# Patient Record
Sex: Male | Born: 1970 | Race: White | Hispanic: No | Marital: Married | Smoking: Never smoker
Health system: Southern US, Community
[De-identification: ages and names within clinical notes are randomized; demographics above are authoritative.]

## PROBLEM LIST (undated history)

## (undated) DIAGNOSIS — E119 Type 2 diabetes mellitus without complications: Secondary | ICD-10-CM

---

## 2013-07-22 ENCOUNTER — Emergency Department (HOSPITAL_COMMUNITY): Payer: BC Managed Care – PPO

## 2013-07-22 ENCOUNTER — Encounter (HOSPITAL_COMMUNITY): Payer: Self-pay | Admitting: Emergency Medicine

## 2013-07-22 ENCOUNTER — Emergency Department (HOSPITAL_COMMUNITY)
Admission: EM | Admit: 2013-07-22 | Discharge: 2013-07-22 | Disposition: A | Discharge status: Home / Self Care | Payer: BC Managed Care – PPO | Attending: Emergency Medicine | Admitting: Emergency Medicine

## 2013-07-22 DIAGNOSIS — S8990XA Unspecified injury of unspecified lower leg, initial encounter: Secondary | ICD-10-CM | POA: Insufficient documentation

## 2013-07-22 DIAGNOSIS — M25562 Pain in left knee: Secondary | ICD-10-CM

## 2013-07-22 DIAGNOSIS — S99929A Unspecified injury of unspecified foot, initial encounter: Secondary | ICD-10-CM

## 2013-07-22 DIAGNOSIS — S022XXA Fracture of nasal bones, initial encounter for closed fracture: Secondary | ICD-10-CM

## 2013-07-22 DIAGNOSIS — S4980XA Other specified injuries of shoulder and upper arm, unspecified arm, initial encounter: Secondary | ICD-10-CM | POA: Insufficient documentation

## 2013-07-22 DIAGNOSIS — M25511 Pain in right shoulder: Secondary | ICD-10-CM

## 2013-07-22 DIAGNOSIS — Y92410 Unspecified street and highway as the place of occurrence of the external cause: Secondary | ICD-10-CM | POA: Insufficient documentation

## 2013-07-22 DIAGNOSIS — Z791 Long term (current) use of non-steroidal anti-inflammatories (NSAID): Secondary | ICD-10-CM | POA: Insufficient documentation

## 2013-07-22 DIAGNOSIS — S99919A Unspecified injury of unspecified ankle, initial encounter: Secondary | ICD-10-CM

## 2013-07-22 DIAGNOSIS — Y9389 Activity, other specified: Secondary | ICD-10-CM | POA: Insufficient documentation

## 2013-07-22 DIAGNOSIS — Z79899 Other long term (current) drug therapy: Secondary | ICD-10-CM | POA: Insufficient documentation

## 2013-07-22 DIAGNOSIS — S46909A Unspecified injury of unspecified muscle, fascia and tendon at shoulder and upper arm level, unspecified arm, initial encounter: Secondary | ICD-10-CM | POA: Insufficient documentation

## 2013-07-22 DIAGNOSIS — E119 Type 2 diabetes mellitus without complications: Secondary | ICD-10-CM | POA: Insufficient documentation

## 2013-07-22 HISTORY — DX: Type 2 diabetes mellitus without complications: E11.9

## 2013-07-22 MED ORDER — NAPROXEN 500 MG PO TABS: 500.0000 mg | ORAL_TABLET | Freq: Two times a day (BID) | ORAL | Status: AC

## 2013-07-22 MED ORDER — OXYCODONE-ACETAMINOPHEN 5-325 MG PO TABS: 1.0000 | ORAL_TABLET | Freq: Four times a day (QID) | ORAL | Status: AC | PRN

## 2013-07-22 MED ORDER — OXYCODONE-ACETAMINOPHEN 5-325 MG PO TABS
1.0000 | ORAL_TABLET | Freq: Once | ORAL | Status: AC
  Administered 2013-07-22: 1 via ORAL
  Filled 2013-07-22: qty 1
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
## 2013-07-22 NOTE — ED Notes (Signed)
Ortho tech called 
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
## 2013-07-31 NOTE — ED Provider Notes (Signed)
Medical screening examination/treatment/procedure(s) were performed by non-physician practitioner and as supervising physician I was immediately available for consultation/collaboration.   EKG Interpretation None       Kaitlynn Tramontana L Camron Essman, MD 07/31/13 1426 
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
